# Patient Record
Sex: Male | Born: 1995 | Race: White | Hispanic: No | Marital: Single | Smoking: Never smoker
Health system: Southern US, Community
[De-identification: ages and names within clinical notes are randomized; demographics above are authoritative.]

---
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
## 2017-06-06 ENCOUNTER — Emergency Department
Admission: EM | Admit: 2017-06-06 | Discharge: 2017-06-06 | Disposition: A | Discharge status: Home / Self Care | Payer: No Typology Code available for payment source | Attending: Emergency Medicine | Admitting: Emergency Medicine

## 2017-06-06 ENCOUNTER — Emergency Department: Payer: No Typology Code available for payment source

## 2017-06-06 DIAGNOSIS — Y9389 Activity, other specified: Secondary | ICD-10-CM | POA: Insufficient documentation

## 2017-06-06 DIAGNOSIS — Y92410 Unspecified street and highway as the place of occurrence of the external cause: Secondary | ICD-10-CM | POA: Diagnosis not present

## 2017-06-06 DIAGNOSIS — Y998 Other external cause status: Secondary | ICD-10-CM | POA: Insufficient documentation

## 2017-06-06 DIAGNOSIS — S2020XA Contusion of thorax, unspecified, initial encounter: Secondary | ICD-10-CM | POA: Diagnosis not present

## 2017-06-06 DIAGNOSIS — Z23 Encounter for immunization: Secondary | ICD-10-CM | POA: Insufficient documentation

## 2017-06-06 DIAGNOSIS — S3991XA Unspecified injury of abdomen, initial encounter: Secondary | ICD-10-CM | POA: Insufficient documentation

## 2017-06-06 DIAGNOSIS — R1030 Lower abdominal pain, unspecified: Secondary | ICD-10-CM | POA: Diagnosis present

## 2017-06-06 DIAGNOSIS — S20219A Contusion of unspecified front wall of thorax, initial encounter: Secondary | ICD-10-CM

## 2017-06-06 DIAGNOSIS — T1490XA Injury, unspecified, initial encounter: Secondary | ICD-10-CM

## 2017-06-06 LAB — CBC WITH DIFFERENTIAL/PLATELET
BASOS ABS: 0.1 10*3/uL (ref 0–0.1)
BASOS PCT: 1 %
EOS PCT: 0 %
Eosinophils Absolute: 0 10*3/uL (ref 0–0.7)
HEMATOCRIT: 46.9 % (ref 40.0–52.0)
Hemoglobin: 16 g/dL (ref 13.0–18.0)
Lymphocytes Relative: 10 %
Lymphs Abs: 1.3 10*3/uL (ref 1.0–3.6)
MCH: 29.2 pg (ref 26.0–34.0)
MCHC: 34.1 g/dL (ref 32.0–36.0)
MCV: 85.6 fL (ref 80.0–100.0)
MONO ABS: 0.8 10*3/uL (ref 0.2–1.0)
MONOS PCT: 6 %
NEUTROS ABS: 11 10*3/uL — AB (ref 1.4–6.5)
Neutrophils Relative %: 83 %
PLATELETS: 256 10*3/uL (ref 150–440)
RBC: 5.47 MIL/uL (ref 4.40–5.90)
RDW: 13.2 % (ref 11.5–14.5)
WBC: 13.1 10*3/uL — ABNORMAL HIGH (ref 3.8–10.6)

## 2017-06-06 LAB — COMPREHENSIVE METABOLIC PANEL
ALBUMIN: 4.8 g/dL (ref 3.5–5.0)
ALK PHOS: 83 U/L (ref 38–126)
ALT: 12 U/L — AB (ref 17–63)
AST: 22 U/L (ref 15–41)
Anion gap: 6 (ref 5–15)
BILIRUBIN TOTAL: 1.1 mg/dL (ref 0.3–1.2)
CALCIUM: 8.9 mg/dL (ref 8.9–10.3)
CO2: 27 mmol/L (ref 22–32)
CREATININE: 0.81 mg/dL (ref 0.61–1.24)
Chloride: 107 mmol/L (ref 101–111)
GFR calc Af Amer: >60 mL/min (ref >60)
GFR calc non Af Amer: >60 mL/min (ref >60)
GLUCOSE: 102 mg/dL — AB (ref 65–99)
Potassium: 3.2 mmol/L — ABNORMAL LOW (ref 3.5–5.1)
SODIUM: 140 mmol/L (ref 135–145)
TOTAL PROTEIN: 7.5 g/dL (ref 6.5–8.1)

## 2017-06-06 LAB — LIPASE, BLOOD: Lipase: 24 U/L (ref 11–51)

## 2017-06-06 MED ORDER — ACETAMINOPHEN 325 MG PO TABS: 650.0000 mg | ORAL_TABLET | Freq: Four times a day (QID) | ORAL | 0 refills | Status: AC | PRN

## 2017-06-06 MED ORDER — KETOROLAC TROMETHAMINE 10 MG PO TABS: 10.0000 mg | ORAL_TABLET | Freq: Four times a day (QID) | ORAL | 0 refills | Status: AC | PRN

## 2017-06-06 MED ORDER — IOPAMIDOL (ISOVUE-300) INJECTION 61%
100.0000 mL | Freq: Once | INTRAVENOUS | Status: AC | PRN
  Administered 2017-06-06: 100 mL via INTRAVENOUS
  Filled 2017-06-06: qty 100

## 2017-06-06 MED ORDER — TETANUS-DIPHTH-ACELL PERTUSSIS 5-2.5-18.5 LF-MCG/0.5 IM SUSP
INTRAMUSCULAR | Status: AC
  Administered 2017-06-06: 0.5 mL via INTRAMUSCULAR
  Filled 2017-06-06: qty 0.5

## 2017-06-06 MED ORDER — TETANUS-DIPHTH-ACELL PERTUSSIS 5-2.5-18.5 LF-MCG/0.5 IM SUSP
0.5000 mL | Freq: Once | INTRAMUSCULAR | Status: AC
  Administered 2017-06-06: 0.5 mL via INTRAMUSCULAR
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
## 2017-06-06 NOTE — ED Notes (Signed)
Spoke with Dr. Lenard LancePaduchowski, orders received.
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
